# Patient Record
Sex: Female | Born: 2005 | Race: Black or African American | Hispanic: No | Marital: Single | Smoking: Never smoker
Health system: Southern US, Community
[De-identification: ages and names within clinical notes are randomized; demographics above are authoritative.]

---
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
## 2023-02-26 ENCOUNTER — Ambulatory Visit: Payer: Medicaid Other

## 2023-03-03 ENCOUNTER — Other Ambulatory Visit: Payer: Medicaid Other

## 2023-04-14 ENCOUNTER — Other Ambulatory Visit (HOSPITAL_COMMUNITY)
Admission: RE | Admit: 2023-04-14 | Discharge: 2023-04-14 | Disposition: A | Discharge status: Home / Self Care | Payer: Medicaid Other | Source: Ambulatory Visit | Attending: Obstetrics & Gynecology | Admitting: Obstetrics & Gynecology

## 2023-04-14 ENCOUNTER — Other Ambulatory Visit: Payer: Medicaid Other | Admitting: *Deleted

## 2023-04-14 DIAGNOSIS — Z113 Encounter for screening for infections with a predominantly sexual mode of transmission: Secondary | ICD-10-CM | POA: Diagnosis present

## 2023-04-14 NOTE — Progress Notes (Addendum)
   NURSE VISIT- VAGINITIS/STD/POC  SUBJECTIVE:  Tracey Clark is a 17 y.o. G0P0000 GYN patientfemale here for a vaginal swab for STD screen.  She reports the following symptoms: none for 0 days. Denies abnormal vaginal bleeding, significant pelvic pain, fever, or UTI symptoms.  OBJECTIVE:  There were no vitals taken for this visit.  Appears well, in no apparent distress  ASSESSMENT: Vaginal swab for STD screen  PLAN: Self-collected vaginal probe for Gonorrhea, Chlamydia, Trichomonas, Bacterial Vaginosis, Yeast sent to lab Treatment: to be determined once results are received Follow-up as needed if symptoms persist/worsen, or new symptoms develop  Annamarie Dawley  04/14/2023 4:30 PM

## 2023-04-16 ENCOUNTER — Telehealth: Payer: Self-pay | Admitting: *Deleted

## 2023-04-16 ENCOUNTER — Other Ambulatory Visit: Payer: Self-pay | Admitting: Adult Health

## 2023-04-16 LAB — CERVICOVAGINAL ANCILLARY ONLY
Bacterial Vaginitis (gardnerella): POSITIVE — AB
Candida Glabrata: NEGATIVE
Candida Vaginitis: POSITIVE — AB
Chlamydia: NEGATIVE
Comment: NEGATIVE
Comment: NEGATIVE
Comment: NEGATIVE
Comment: NEGATIVE
Comment: NEGATIVE
Comment: NORMAL
Neisseria Gonorrhea: NEGATIVE
Trichomonas: NEGATIVE

## 2023-04-16 MED ORDER — METRONIDAZOLE 500 MG PO TABS: 500.0000 mg | ORAL_TABLET | Freq: Two times a day (BID) | ORAL | 0 refills | Status: DC

## 2023-04-16 MED ORDER — FLUCONAZOLE 150 MG PO TABS: ORAL_TABLET | ORAL | 1 refills | Status: DC

## 2023-04-16 NOTE — Telephone Encounter (Signed)
Left message @ 1:03 pm. JSY 

## 2023-04-16 NOTE — Telephone Encounter (Signed)
-----   Message from Tracey Potter, NP sent at 04/16/2023 12:21 PM EDT ----- Let her know +BV and yeast and rx sent for flagyl and diflucan no sex or alcohol while taking... THX

## 2023-04-16 NOTE — Progress Notes (Signed)
+  BV and yeast on vaginal swab will rx flagyl and diflucan ,no sex or alcohol while taking  

## 2023-04-17 NOTE — Telephone Encounter (Signed)
Left message @ 12:00 pm. JSY

## 2023-04-21 ENCOUNTER — Telehealth: Payer: Self-pay | Admitting: Adult Health

## 2023-04-21 NOTE — Telephone Encounter (Signed)
Patient returning call for test results asking a call back

## 2023-04-22 NOTE — Telephone Encounter (Signed)
Attempted to call patient back, heard message that caller is not available.

## 2023-04-27 NOTE — Telephone Encounter (Signed)
Left message @ 11:02 am. JSY

## 2023-04-27 NOTE — Telephone Encounter (Signed)
Left message @ 4:47 pm. JSY 

## 2023-04-29 NOTE — Telephone Encounter (Signed)
Pt aware swab was + for BV and yeast. Meds sent to pharmacy. No sex or alcohol while taking meds. Pt voiced understanding. JSY

## 2023-07-27 ENCOUNTER — Other Ambulatory Visit (HOSPITAL_COMMUNITY)
Admission: RE | Admit: 2023-07-27 | Discharge: 2023-07-27 | Disposition: A | Discharge status: Home / Self Care | Payer: Medicaid Other | Source: Ambulatory Visit | Attending: Obstetrics & Gynecology | Admitting: Obstetrics & Gynecology

## 2023-07-27 ENCOUNTER — Other Ambulatory Visit (INDEPENDENT_AMBULATORY_CARE_PROVIDER_SITE_OTHER): Payer: Medicaid Other | Admitting: *Deleted

## 2023-07-27 DIAGNOSIS — N898 Other specified noninflammatory disorders of vagina: Secondary | ICD-10-CM | POA: Diagnosis present

## 2023-07-27 NOTE — Progress Notes (Signed)
   NURSE VISIT- VAGINITIS/STD  SUBJECTIVE:  Tracey Clark is a 17 y.o. G0P0000 GYN patientfemale here for a vaginal swab for vaginitis screening, STD screen.  She reports the following symptoms:  vaginal discharge, irritation and odor  for several days. Pt states her breasts are itching X 3 weeks. Advised she would need to see a provider if she wants that evaluated. Denies abnormal vaginal bleeding, significant pelvic pain, fever, or UTI symptoms.  OBJECTIVE:  There were no vitals taken for this visit.  Appears well, in no apparent distress  ASSESSMENT: Vaginal swab for vaginitis screening & STD screening.  PLAN: Self-collected vaginal probe for Gonorrhea, Chlamydia, Trichomonas, Bacterial Vaginosis, Yeast sent to lab Treatment: to be determined once results are received Follow-up as needed if symptoms persist/worsen, or new symptoms develop  Malachy Mood  07/27/2023 2:28 PM

## 2023-07-29 LAB — CERVICOVAGINAL ANCILLARY ONLY
Bacterial Vaginitis (gardnerella): POSITIVE — AB
Candida Glabrata: NEGATIVE
Candida Vaginitis: NEGATIVE
Chlamydia: NEGATIVE
Comment: NEGATIVE
Comment: NEGATIVE
Comment: NEGATIVE
Comment: NEGATIVE
Comment: NEGATIVE
Comment: NORMAL
Neisseria Gonorrhea: NEGATIVE
Trichomonas: NEGATIVE

## 2023-07-30 ENCOUNTER — Other Ambulatory Visit: Payer: Self-pay | Admitting: Adult Health

## 2023-07-30 MED ORDER — METRONIDAZOLE 500 MG PO TABS: 500.0000 mg | ORAL_TABLET | Freq: Two times a day (BID) | ORAL | 0 refills | Status: DC

## 2023-07-30 NOTE — Progress Notes (Signed)
+  BV on vaginal swab, will rx flagyl, no sex or alcohol while taking  

## 2023-08-03 ENCOUNTER — Telehealth: Payer: Self-pay | Admitting: *Deleted

## 2023-08-03 NOTE — Telephone Encounter (Signed)
Multiple attempts to reach pt without success. Note placed in chart that we need updated phone number. Closing encounter. JSY

## 2023-08-03 NOTE — Telephone Encounter (Signed)
-----   Message from Cameron sent at 07/30/2023  4:54 PM EDT ----- Let her know about BV and that flagyl sent in no sex or alcohol while taking THX

## 2023-11-04 ENCOUNTER — Other Ambulatory Visit: Payer: Medicaid Other

## 2024-01-26 ENCOUNTER — Ambulatory Visit

## 2024-01-26 VITALS — BP 131/80 | HR 78 | Ht 65.0 in | Wt 160.0 lb

## 2024-01-26 DIAGNOSIS — L309 Dermatitis, unspecified: Secondary | ICD-10-CM | POA: Diagnosis not present

## 2024-01-26 MED ORDER — TRIAMCINOLONE ACETONIDE 0.1 % EX CREA: 1.0000 | TOPICAL_CREAM | Freq: Two times a day (BID) | CUTANEOUS | 2 refills | Status: AC

## 2024-01-26 NOTE — Assessment & Plan Note (Signed)
 Rash improving on thighs, but not on elbows.  Add steroid cream for two times a day as needed.  Also recommend adding daily Claritin or Zyrtec for itch relief.  Recommend dermatology consult if no improvement.

## 2024-01-26 NOTE — Patient Instructions (Signed)
 Triamcinolone (steroid cream) sent to pharmacy.

## 2024-01-26 NOTE — Progress Notes (Signed)
 New Patient Office Visit  Subjective    Patient ID: Tracey Clark, female    DOB: 02-Feb-2006  Age: 18 y.o. MRN: [id number]  CC:  Chief Complaint  Patient presents with   Establish Care    Pt. States she has rashes down her inner thighs and arms, since January and it itches really bad and gets irritated. Pt also states the only thing that keeps it from itching is showers and vasiline.    HPI Tracey Clark presents to establish care   Outpatient Encounter Medications as of 01/26/2024  Medication Sig   Acetaminophen (TYLENOL PO) Take by mouth.   diphenhydrAMINE HCl (BENADRYL PO) Take by mouth.   etonogestrel (NEXPLANON) 68 MG IMPL implant 1 each (68 mg total) by Subdermal route once.   triamcinolone cream (KENALOG) 0.1 % Apply 1 Application topically 2 (two) times daily.   [DISCONTINUED] metroNIDAZOLE (FLAGYL) 500 MG tablet Take 1 tablet (500 mg total) by mouth 2 (two) times daily.   [DISCONTINUED] MELATONIN PO Take by mouth. (Patient not taking: Reported on 01/26/2024)   No facility-administered encounter medications on file as of 01/26/2024.    History reviewed. No pertinent past medical history.  History reviewed. No pertinent surgical history.  Family History  Problem Relation Age of Onset   Hypertension Maternal Grandmother    Cancer Maternal Grandfather    Other Father        gun shot wound-2005   Healthy Mother    Asthma Brother    Asthma Cousin    Diabetes Neg Hx    Heart disease Neg Hx     Social History   Socioeconomic History   Marital status: Single    Spouse name: Not on file   Number of children: Not on file   Years of education: Not on file   Highest education level: Not on file  Occupational History   Not on file  Tobacco Use   Smoking status: Never    Passive exposure: Yes   Smokeless tobacco: Never  Vaping Use   Vaping status: Never Used  Substance and Sexual Activity   Alcohol use: No   Drug use: Yes    Types: Marijuana     Comment: " not often"   Sexual activity: Not Currently    Birth control/protection: Implant  Other Topics Concern   Not on file  Social History Narrative   ** Merged History Encounter **       Lives with mom  Brother and moms BF Both adults smoke   Social Drivers of Corporate investment banker Strain: Not on file  Food Insecurity: Not on file  Transportation Needs: Not on file  Physical Activity: Not on file  Stress: Not on file  Social Connections: Not on file  Intimate Partner Violence: Not on file    Review of Systems  Constitutional: Negative.   Respiratory: Negative.    Cardiovascular: Negative.   Skin:  Positive for itching and rash (inner thighs and inside of elbows).  Neurological: Negative.   Psychiatric/Behavioral: Negative.          Objective    BP 131/80 (BP Location: Left Arm, Patient Position: Sitting, Cuff Size: Normal)   Pulse 78   Ht 5\' 5"  (1.651 m)   Wt 160 lb 0.6 oz (72.6 kg)   SpO2 98%   BMI 26.63 kg/m   Physical Exam Vitals and nursing note reviewed.  Constitutional:      Appearance: Normal appearance.  Skin:  Findings: Rash present. Rash is scaling (bilateral inner thighs, and bilat anticubital fossa).  Neurological:     Mental Status: She is alert and oriented to person, place, and time.  Psychiatric:        Mood and Affect: Mood normal.        Thought Content: Thought content normal.         Assessment & Plan:   Problem List Items Addressed This Visit       Musculoskeletal and Integument   Eczema - Primary   Rash improving on thighs, but not on elbows.  Add steroid cream for two times a day as needed.  Also recommend adding daily Claritin or Zyrtec for itch relief.  Recommend dermatology consult if no improvement.       Relevant Medications   triamcinolone cream (KENALOG) 0.1 %    No follow-ups on file.   Darral Dash, FNP

## 2024-02-09 ENCOUNTER — Ambulatory Visit

## 2024-02-29 ENCOUNTER — Ambulatory Visit

## 2024-03-16 ENCOUNTER — Ambulatory Visit

## 2024-04-12 ENCOUNTER — Ambulatory Visit: Admitting: *Deleted

## 2024-04-12 ENCOUNTER — Other Ambulatory Visit (HOSPITAL_COMMUNITY)
Admission: RE | Admit: 2024-04-12 | Discharge: 2024-04-12 | Disposition: A | Discharge status: Home / Self Care | Source: Ambulatory Visit | Attending: Obstetrics & Gynecology | Admitting: Obstetrics & Gynecology

## 2024-04-12 DIAGNOSIS — Z3202 Encounter for pregnancy test, result negative: Secondary | ICD-10-CM

## 2024-04-12 DIAGNOSIS — Z113 Encounter for screening for infections with a predominantly sexual mode of transmission: Secondary | ICD-10-CM | POA: Insufficient documentation

## 2024-04-12 LAB — POCT URINE PREGNANCY: Preg Test, Ur: NEGATIVE

## 2024-04-12 NOTE — Progress Notes (Signed)
   NURSE VISIT- VAGINITIS/STD/POC  SUBJECTIVE:  Tracey Clark is a 18 y.o. G0P0000 GYN patientfemale here for a vaginal swab for STD screen.  She reports the following symptoms: none for 0 days. Denies abnormal vaginal bleeding, significant pelvic pain, fever, or UTI symptoms.  OBJECTIVE:  There were no vitals taken for this visit.  Appears well, in no apparent distress  ASSESSMENT: Vaginal swab for STD screen  PLAN: Self-collected vaginal probe for Gonorrhea, Chlamydia, Trichomonas, Bacterial Vaginosis, Yeast sent to lab Treatment: to be determined once results are received Follow-up as needed if symptoms persist/worsen, or new symptoms develop  Also did preg test due to nausea, results negative.   Kerrie Peek  04/12/2024 1:35 PM

## 2024-04-13 ENCOUNTER — Ambulatory Visit: Payer: Self-pay | Admitting: Obstetrics & Gynecology

## 2024-04-13 DIAGNOSIS — B9689 Other specified bacterial agents as the cause of diseases classified elsewhere: Secondary | ICD-10-CM

## 2024-04-13 LAB — CERVICOVAGINAL ANCILLARY ONLY
Bacterial Vaginitis (gardnerella): POSITIVE — AB
Candida Glabrata: NEGATIVE
Candida Vaginitis: NEGATIVE
Chlamydia: NEGATIVE
Comment: NEGATIVE
Comment: NEGATIVE
Comment: NEGATIVE
Comment: NEGATIVE
Comment: NEGATIVE
Comment: NORMAL
Neisseria Gonorrhea: NEGATIVE
Trichomonas: NEGATIVE

## 2024-04-13 MED ORDER — METRONIDAZOLE 500 MG PO TABS: 500.0000 mg | ORAL_TABLET | Freq: Two times a day (BID) | ORAL | 2 refills | Status: AC

## 2024-05-11 ENCOUNTER — Other Ambulatory Visit: Payer: Self-pay | Admitting: Obstetrics & Gynecology

## 2024-05-11 DIAGNOSIS — N760 Acute vaginitis: Secondary | ICD-10-CM

## 2024-05-11 MED ORDER — METRONIDAZOLE 500 MG PO TABS: 500.0000 mg | ORAL_TABLET | Freq: Two times a day (BID) | ORAL | 0 refills | Status: AC

## 2024-05-11 NOTE — Progress Notes (Signed)
 Rx for BV  Myna Hidalgo, DO Attending Obstetrician & Gynecologist, [facility] for [employer], [facility]

## 2024-09-08 ENCOUNTER — Encounter: Payer: Self-pay | Admitting: Women's Health

## 2024-09-08 ENCOUNTER — Ambulatory Visit (INDEPENDENT_AMBULATORY_CARE_PROVIDER_SITE_OTHER): Admitting: Women's Health

## 2024-09-08 VITALS — BP 125/80 | HR 94 | Ht 65.0 in | Wt 165.0 lb

## 2024-09-08 DIAGNOSIS — Z3046 Encounter for surveillance of implantable subdermal contraceptive: Secondary | ICD-10-CM

## 2024-09-08 DIAGNOSIS — Z30017 Encounter for initial prescription of implantable subdermal contraceptive: Secondary | ICD-10-CM

## 2024-09-08 DIAGNOSIS — Z3202 Encounter for pregnancy test, result negative: Secondary | ICD-10-CM | POA: Diagnosis not present

## 2024-09-08 LAB — POCT URINE PREGNANCY: Preg Test, Ur: NEGATIVE

## 2024-09-08 MED ORDER — ETONOGESTREL 68 MG ~~LOC~~ IMPL
68.0000 mg | DRUG_IMPLANT | Freq: Once | SUBCUTANEOUS | Status: AC
  Administered 2024-09-08: 68 mg via SUBCUTANEOUS

## 2024-09-08 NOTE — Patient Instructions (Signed)
 Keep the area clean and dry.  You can remove the big bandage in 24 hours, and the small steri-strip bandage in 3-5 days.  A back up method, such as condoms, should be used for two weeks. You may have irregular vaginal bleeding for the first 6 months after the Nexplanon  is placed, then the bleeding usually lightens and it is possible that you may not have any periods.  If you have any concerns, please give us  a call.    Etonogestrel  Implant What is this medication? ETONOGESTREL  (et oh noe JES trel) prevents ovulation and pregnancy. It belongs to a group of medications called contraceptives. This medication is a progestin hormone. This medicine may be used for other purposes; ask your health care provider or pharmacist if you have questions. COMMON BRAND NAME(S): Implanon , Nexplanon  What should I tell my care team before I take this medication? They need to know if you have any of these conditions: Abnormal vaginal bleeding Blood clots Blood vessel disease Breast, cervical, endometrial, ovarian, liver, or uterine cancer Diabetes Gallbladder disease Heart disease or recent heart attack High blood pressure High cholesterol or triglycerides Kidney disease Liver disease Migraine headaches Seizures Stroke Tobacco use An unusual or allergic reaction to etonogestrel , other medications, foods, dyes, or preservatives Pregnant or trying to get pregnant Breastfeeding How should I use this medication? This device is inserted just under the skin on the inner side of your upper arm by your care team. Talk to your care team about the use of this medication in children. Special care may be needed. Overdosage: If you think you have taken too much of this medicine contact a poison control center or emergency room at once. NOTE: This medicine is only for you. Do not share this medicine with others. What if I miss a dose? This does not apply. What may interact with this medication? Do not take this  medication with any of the following: Amprenavir Fosamprenavir This medication may also interact with the following: Acitretin Aprepitant Armodafinil Bexarotene Bosentan Carbamazepine Certain antivirals for HIV or hepatitis Certain medications for fungal infections, such as fluconazole , ketoconazole, itraconazole, or voriconazole Cyclosporine Felbamate Griseofulvin Lamotrigine Modafinil Oxcarbazepine Phenobarbital Phenytoin Primidone Rifabutin Rifampin Rifapentine St. John's wort Topiramate This list may not describe all possible interactions. Give your health care provider a list of all the medicines, herbs, non-prescription drugs, or dietary supplements you use. Also tell them if you smoke, drink alcohol, or use illegal drugs. Some items may interact with your medicine. What should I watch for while using this medication? Visit your care team for regular checks on your progress. Using this medication does not protect you or your partner against HIV or other sexually transmitted infections (STIs). You should be able to feel the implant by pressing your fingertips over the skin where it was inserted. Contact your care team if you cannot feel the implant, and use a non-hormonal birth control method (such as condoms) until your care team confirms that the implant is in place. Contact your care team if you think that the implant may have broken or become bent while in your arm. You will receive a user card from your care team after the implant is inserted. The card is a record of the location of the implant in your upper arm and when it should be removed. Keep this card with your health records. What side effects may I notice from receiving this medication? Side effects that you should report to your care team as soon as  possible: Allergic reactions--skin rash, itching, hives, swelling of the face, lips, tongue, or throat Blood clot--pain, swelling, or warmth in the leg, shortness of  breath, chest pain Gallbladder problems--severe stomach pain, nausea, vomiting, fever Increase in blood pressure Liver injury--right upper belly pain, loss of appetite, nausea, light-colored stool, dark yellow or brown urine, yellowing skin or eyes, unusual weakness or fatigue New or worsening migraines or headaches Pain, redness, or irritation at injection site Stroke--sudden numbness or weakness of the face, arm, or leg, trouble speaking, confusion, trouble walking, loss of balance or coordination, dizziness, severe headache, change in vision Unusual vaginal discharge, itching, or odor Worsening mood, feelings of depression Side effects that usually do not require medical attention (report to your care team if they continue or are bothersome): Breast pain or tenderness Dark patches of skin on the face or other sun-exposed areas Irregular menstrual cycles or spotting Nausea Weight gain This list may not describe all possible side effects. Call your doctor for medical advice about side effects. You may report side effects to FDA at 1-800-FDA-1088. Where should I keep my medication? This medication is given in a hospital or clinic and will not be stored at home. NOTE: This sheet is a summary. It may not cover all possible information. If you have questions about this medicine, talk to your doctor, pharmacist, or health care provider.  2024 [employer]vier/Gold Standard (2022-05-20 00:00:00)

## 2024-09-08 NOTE — Addendum Note (Signed)
 Addended by: SANNA GONG A on: 09/08/2024 03:45 PM   Modules accepted: Orders

## 2024-09-08 NOTE — Progress Notes (Signed)
   NEXPLANON  REMOVAL AND RE-INSERTION Patient name: Tracey Clark MRN [id number]  Date of birth: 07-Jan-2006 Subjective Findings:   Tracey Clark is a 18 y.o. G0P0000 African American female being seen today for Nexplanon  removal and re-insertion. Her Nexplanon  was placed 08/21/21.   No LMP recorded. Last pap<21yo. Results were: N/A  Risks/benefits/side effects of Nexplanon  have been discussed and her questions have been answered.  Specifically, a failure rate of 10/998 has been reported, with an increased failure rate if pt takes St. John's Wort and/or antiseizure medicaitons.  She is aware of the common side effect of irregular bleeding, which the incidence of decreases over time. Signed copy of informed consent in chart.      01/26/2024    1:09 PM  Depression screen PHQ 2/9  Decreased Interest 1  Down, Depressed, Hopeless 2  PHQ - 2 Score 3  Altered sleeping 0  Tired, decreased energy 1  Change in appetite 0  Feeling bad or failure about yourself  0  Trouble concentrating 0  Moving slowly or fidgety/restless 0  Suicidal thoughts 0  PHQ-9 Score 4   Difficult doing work/chores Not difficult at all     Data saved with a previous flowsheet row definition        01/26/2024    1:10 PM  GAD 7 : Generalized Anxiety Score  Nervous, Anxious, on Edge 1  Control/stop worrying 3  Worry too much - different things 3  Trouble relaxing 2  Restless 0  Easily annoyed or irritable 2  Afraid - awful might happen 0  Total GAD 7 Score 11  Anxiety Difficulty Not difficult at all     Pertinent History Reviewed:   Reviewed past medical,surgical, social, obstetrical and family history.  Reviewed problem list, medications and allergies. Objective Findings & Procedure:    Vitals:   09/08/24 1457  BP: 125/80  Pulse: 94  Weight: 165 lb (74.8 kg)  Height: 5' 5 (1.651 m)  Body mass index is 27.46 kg/m.  No results found for this or any previous visit (from the past 24  hours).   Time out was performed.  Nexplanon  site identified.  Area prepped in usual sterile fashon. Two cc's of 2% lidocaine  was used to anesthetize the area. A small stab incision was made right beside the implant on the distal portion.  The Nexplanon  rod was grasped using hemostats and removed intact without difficulty.  The area was cleansed again with betadine  and the Nexplanon  was inserted approximately 10cm from the medial epicondyle and 3-5cm posterior to the sulcus per manufacturer's recommendations without difficulty.  Steri-strips and a pressure bandage was applied.  There was less than 3 cc blood loss. There were no complications.  The patient tolerated the procedure well. Assessment & Plan:   1) Nexplanon  removal & re-insertion She was instructed to keep the area clean and dry, remove pressure bandage in 24 hours, and keep insertion site covered with the steri-strips for 3-5 days.  She was given a card indicating date Nexplanon  was inserted and date it needs to be removed.  Follow-up PRN problems.  Orders Placed This Encounter  Procedures   POCT urine pregnancy    Follow-up: Return for @19yo  for , Pap & physical.  Suzen JONELLE Fetters CNM, [facility] 09/08/2024 3:19 PM
